# Patient Record
Sex: Female | Born: 1970 | Race: Black or African American | Hispanic: No | Smoking: Never smoker
Health system: Southern US, Community
[De-identification: ages and names within clinical notes are randomized; demographics above are authoritative.]

## PROBLEM LIST (undated history)

## (undated) DIAGNOSIS — E669 Obesity, unspecified: Secondary | ICD-10-CM

## (undated) HISTORY — PX: EYE SURGERY: SHX253

---

## 2018-09-02 ENCOUNTER — Encounter (HOSPITAL_BASED_OUTPATIENT_CLINIC_OR_DEPARTMENT_OTHER): Payer: Self-pay | Admitting: Emergency Medicine

## 2018-09-02 ENCOUNTER — Emergency Department (HOSPITAL_BASED_OUTPATIENT_CLINIC_OR_DEPARTMENT_OTHER)
Admission: EM | Admit: 2018-09-02 | Discharge: 2018-09-02 | Disposition: A | Discharge status: Home / Self Care | Payer: Self-pay | Attending: Emergency Medicine | Admitting: Emergency Medicine

## 2018-09-02 ENCOUNTER — Emergency Department (HOSPITAL_BASED_OUTPATIENT_CLINIC_OR_DEPARTMENT_OTHER): Payer: Self-pay

## 2018-09-02 ENCOUNTER — Other Ambulatory Visit: Payer: Self-pay

## 2018-09-02 DIAGNOSIS — N83291 Other ovarian cyst, right side: Secondary | ICD-10-CM | POA: Insufficient documentation

## 2018-09-02 DIAGNOSIS — A599 Trichomoniasis, unspecified: Secondary | ICD-10-CM

## 2018-09-02 DIAGNOSIS — A5900 Urogenital trichomoniasis, unspecified: Secondary | ICD-10-CM | POA: Insufficient documentation

## 2018-09-02 HISTORY — DX: Obesity, unspecified: E66.9

## 2018-09-02 LAB — URINALYSIS, ROUTINE W REFLEX MICROSCOPIC
Bilirubin Urine: NEGATIVE
Glucose, UA: NEGATIVE mg/dL
Hgb urine dipstick: NEGATIVE
Ketones, ur: NEGATIVE mg/dL
Nitrite: NEGATIVE
Protein, ur: NEGATIVE mg/dL
Specific Gravity, Urine: 1.01 (ref 1.005–1.030)
pH: 6.5 (ref 5.0–8.0)

## 2018-09-02 LAB — URINALYSIS, MICROSCOPIC (REFLEX)

## 2018-09-02 LAB — WET PREP, GENITAL
Sperm: NONE SEEN
Yeast Wet Prep HPF POC: NONE SEEN

## 2018-09-02 LAB — PREGNANCY, URINE: Preg Test, Ur: NEGATIVE

## 2018-09-02 MED ORDER — LIDOCAINE HCL (PF) 1 % IJ SOLN
INTRAMUSCULAR | Status: AC
  Administered 2018-09-02: 1.2 mL
  Filled 2018-09-02: qty 5

## 2018-09-02 MED ORDER — CEFTRIAXONE SODIUM 250 MG IJ SOLR
250.0000 mg | Freq: Once | INTRAMUSCULAR | Status: AC
  Administered 2018-09-02: 250 mg via INTRAMUSCULAR
  Filled 2018-09-02: qty 250

## 2018-09-02 MED ORDER — METRONIDAZOLE 500 MG PO TABS: 500.0000 mg | ORAL_TABLET | Freq: Two times a day (BID) | ORAL | 0 refills | Status: AC

## 2018-09-02 MED ORDER — DOXYCYCLINE HYCLATE 100 MG PO CAPS: 100.0000 mg | ORAL_CAPSULE | Freq: Two times a day (BID) | ORAL | 0 refills | Status: AC

## 2018-09-02 MED FILL — metroNIDAZOLE 500 MG TABS: 500 | 14 days supply | Qty: 28 | Fill #0

## 2018-09-02 MED FILL — DOXYCYCLINE HYCLATE 100 MG: 100 | 14 days supply | Qty: 28 | Fill #0

## 2018-09-02 NOTE — ED Triage Notes (Signed)
Left sided abd pain that radiates to left flank.  Also yellow vaginal discharge and itching.

## 2018-09-02 NOTE — ED Notes (Signed)
Patient transported to Ultrasound 

## 2018-09-02 NOTE — ED Notes (Signed)
ED Provider at bedside. 

## 2018-09-02 NOTE — ED Provider Notes (Signed)
[facility] EMERGENCY DEPARTMENT Provider Note   CSN: [id number] Arrival date & time: 09/02/18  4098    History   Chief Complaint Chief Complaint  Patient presents with   Abdominal Pain    HPI Victoria Reese is a 48 y.o. female.     49 year old female who presents with left-sided abdominal pain.  2 days ago, she began having intermittent, sharp left-sided abdominal pain that has been coming and going but has progressively worsened since it began.  She states that laying on her left side and moving certain ways makes the pain worse.  She has been eating and drinking normally with no nausea, vomiting, diarrhea, constipation, or urinary symptoms.  1 day ago, she began having yellow vaginal discharge which is unusual for her.  No vaginal bleeding.  Last menstrual period was mid March.  She is sexually active with one long-term partner, does not use protection.  The history is provided by the patient.  Abdominal Pain    Past Medical History:  Diagnosis Date   Obesity     There are no active problems to display for this patient.   Past Surgical History:  Procedure Laterality Date   EYE SURGERY Bilateral    X2:  Age 30 and Age 89      Patient unsure of eye condition     OB History    Gravida  6   Para  6   Term  6   Preterm  0   AB  0   Living  6     SAB  0   TAB  0   Ectopic  0   Multiple  0   Live Births  6        Obstetric Comments  All term, vaginal deliveries         Home Medications    Prior to Admission medications   Medication Sig Start Date End Date Taking? Authorizing Provider  UNABLE TO FIND Apply 1 drop to eye 2 (two) times a day. Prednisone eye drops   Yes [provider]  doxycycline (VIBRAMYCIN) 100 MG capsule Take 1 capsule (100 mg total) by mouth 2 (two) times daily. 09/02/18   Yolandra Habig, Ambrose Finland, MD  metroNIDAZOLE (FLAGYL) 500 MG tablet Take 1 tablet (500 mg total) by mouth 2 (two) times daily. Do not drink  alcohol while on this medication 09/02/18   Tamelia Michalowski, Ambrose Finland, MD    Family History No family history on file.  Social History Social History   Tobacco Use   Smoking status: Never Smoker   Smokeless tobacco: Never Used  Substance Use Topics   Alcohol use: Never    Frequency: Never   Drug use: Never     Allergies   Patient has no known allergies.   Review of Systems Review of Systems  Gastrointestinal: Positive for abdominal pain.   All other systems reviewed and are negative except that which was mentioned in HPI   Physical Exam Updated Vital Signs BP (!) 141/88 (BP Location: Right Arm)    Pulse 61    Temp 98.1 F (36.7 C) (Oral)    Resp 16    Ht  (1.626 m)    Wt 74.8 kg    LMP 07/17/2018 Comment: Irregular periods since March 2020   SpO2 100%    BMI 28.32 kg/m   Physical Exam Vitals signs and nursing note reviewed. Exam conducted with a chaperone present.  Constitutional:  General: She is not in acute distress.    Appearance: She is well-developed.  HENT:     Head: Normocephalic and atraumatic.  Eyes:     Conjunctiva/sclera: Conjunctivae normal.  Neck:     Musculoskeletal: Neck supple.  Cardiovascular:     Rate and Rhythm: Normal rate and regular rhythm.     Heart sounds: Normal heart sounds. No murmur.  Pulmonary:     Effort: Pulmonary effort is normal.     Breath sounds: Normal breath sounds.  Abdominal:     General: Bowel sounds are normal. There is no distension.     Palpations: Abdomen is soft.     Tenderness: There is abdominal tenderness in the suprapubic area and left lower quadrant. There is no guarding or rebound.  Genitourinary:    Vagina: Vaginal discharge present.     Cervix: Discharge and friability present. No cervical motion tenderness.     Uterus: Normal.      Adnexa: Right adnexa normal.       Left: Tenderness present. No mass.       Comments: Copious yellow-white discharge in vaginal vault and cervical office, friable  cervix, left adnexal tenderness with no CMT Skin:    General: Skin is warm and dry.  Neurological:     Mental Status: She is alert and oriented to person, place, and time.     Comments: Fluent speech  Psychiatric:        Judgment: Judgment normal.      ED Treatments / Results  Labs (all labs ordered are listed, but only abnormal results are displayed) Labs Reviewed  WET PREP, GENITAL - Abnormal; Notable for the following components:      Result Value   Trich, Wet Prep PRESENT (*)    Clue Cells Wet Prep HPF POC PRESENT (*)    WBC, Wet Prep HPF POC MANY (*)    All other components within normal limits  URINALYSIS, ROUTINE W REFLEX MICROSCOPIC - Abnormal; Notable for the following components:   Leukocytes,Ua MODERATE (*)    All other components within normal limits  URINALYSIS, MICROSCOPIC (REFLEX) - Abnormal; Notable for the following components:   Bacteria, UA FEW (*)    Trichomonas, UA PRESENT (*)    All other components within normal limits  PREGNANCY, URINE  GC/CHLAMYDIA PROBE AMP ([place]) NOT AT [facility]    EKG None  Radiology US Transvaginal Non-ob  Result Date: 09/02/2018 CLINICAL DATA:  Left pelvic pain for 3 days. Clinical suspicion for ovarian torsion. LMP 07/17/2018. EXAM: TRANSABDOMINAL AND TRANSVAGINAL ULTRASOUND OF PELVIS DOPPLER ULTRASOUND OF OVARIES TECHNIQUE: Both transabdominal and transvaginal ultrasound examinations of the pelvis were performed. Transabdominal technique was performed for global imaging of the pelvis including uterus, ovaries, adnexal regions, and pelvic cul-de-sac. It was necessary to proceed with endovaginal exam following the transabdominal exam to visualize the ovaries and cystic lesion in right adnexa. Color and duplex Doppler ultrasound was utilized to evaluate blood flow to the ovaries. COMPARISON:  None. FINDINGS: Uterus Measurements: 13.0 x 6.3 x 8.0 cm = volume: 346 mL. Several small uterine fibroids are seen, largest measuring 2.3  cm. Endometrium Thickness: 10 mm.  No focal abnormality visualized. Right ovary Measurements: 3.3 x 2.6 x 3.1 cm = volume: 13.8 mL. A cystic lesion with a mural nodular density is seen which measures 2.6 x 2.4 x 2.4 cm. The mural nodule measures 1.1 cm. A thickened septation is also seen measuring 4 mm in thickness. No blood flow is seen  within the mural nodule or septation on color Doppler ultrasound. Left ovary Measurements: 1.6 x 2.3 x 2.3 cm = volume: 4.5 mL. Normal appearance/no adnexal mass. Pulsed Doppler evaluation of both ovaries demonstrates normal low-resistance arterial and venous waveforms. Other findings No abnormal free fluid. IMPRESSION: No sonographic evidence of ovarian torsion. Small uterine fibroids measuring up to 2.3 cm. 2.6 cm complex cystic lesion in right ovary, which contains a solid mural nodule and thick septation. This is suspicious for cystic ovarian neoplasm. Recommend nonemergent pelvic MRI without and with contrast for further characterization. This recommendation follows the consensus statement: Management of Asymptomatic Ovarian and Other Adnexal Cysts Imaged at US: Society of Radiologists in Ultrasound Consensus Conference Statement. Radiology 2010; [phone]. Electronically Signed   By: Myles RosenthalJohn  Stahl M.D.   On: 09/02/2018 09:52   [place] Pelvis Complete  Result Date: 09/02/2018 CLINICAL DATA:  Left pelvic pain for 3 days. Clinical suspicion for ovarian torsion. LMP 07/17/2018. EXAM: TRANSABDOMINAL AND TRANSVAGINAL ULTRASOUND OF PELVIS DOPPLER ULTRASOUND OF OVARIES TECHNIQUE: Both transabdominal and transvaginal ultrasound examinations of the pelvis were performed. Transabdominal technique was performed for global imaging of the pelvis including uterus, ovaries, adnexal regions, and pelvic cul-de-sac. It was necessary to proceed with endovaginal exam following the transabdominal exam to visualize the ovaries and cystic lesion in right adnexa. Color and duplex Doppler ultrasound was  utilized to evaluate blood flow to the ovaries. COMPARISON:  None. FINDINGS: Uterus Measurements: 13.0 x 6.3 x 8.0 cm = volume: 346 mL. Several small uterine fibroids are seen, largest measuring 2.3 cm. Endometrium Thickness: 10 mm.  No focal abnormality visualized. Right ovary Measurements: 3.3 x 2.6 x 3.1 cm = volume: 13.8 mL. A cystic lesion with a mural nodular density is seen which measures 2.6 x 2.4 x 2.4 cm. The mural nodule measures 1.1 cm. A thickened septation is also seen measuring 4 mm in thickness. No blood flow is seen within the mural nodule or septation on color Doppler ultrasound. Left ovary Measurements: 1.6 x 2.3 x 2.3 cm = volume: 4.5 mL. Normal appearance/no adnexal mass. Pulsed Doppler evaluation of both ovaries demonstrates normal low-resistance arterial and venous waveforms. Other findings No abnormal free fluid. IMPRESSION: No sonographic evidence of ovarian torsion. Small uterine fibroids measuring up to 2.3 cm. 2.6 cm complex cystic lesion in right ovary, which contains a solid mural nodule and thick septation. This is suspicious for cystic ovarian neoplasm. Recommend nonemergent pelvic MRI without and with contrast for further characterization. This recommendation follows the consensus statement: Management of Asymptomatic Ovarian and Other Adnexal Cysts Imaged at US: Society of Radiologists in Ultrasound Consensus Conference Statement. Radiology 2010; [phone]. Electronically Signed   By: Myles RosenthalJohn  Stahl M.D.   On: 09/02/2018 09:52   [place] Art/ven Flow Abd Pelv Doppler  Result Date: 09/02/2018 CLINICAL DATA:  Left pelvic pain for 3 days. Clinical suspicion for ovarian torsion. LMP 07/17/2018. EXAM: TRANSABDOMINAL AND TRANSVAGINAL ULTRASOUND OF PELVIS DOPPLER ULTRASOUND OF OVARIES TECHNIQUE: Both transabdominal and transvaginal ultrasound examinations of the pelvis were performed. Transabdominal technique was performed for global imaging of the pelvis including uterus, ovaries, adnexal  regions, and pelvic cul-de-sac. It was necessary to proceed with endovaginal exam following the transabdominal exam to visualize the ovaries and cystic lesion in right adnexa. Color and duplex Doppler ultrasound was utilized to evaluate blood flow to the ovaries. COMPARISON:  None. FINDINGS: Uterus Measurements: 13.0 x 6.3 x 8.0 cm = volume: 346 mL. Several small uterine fibroids are seen, largest measuring 2.3 cm. Endometrium  Thickness: 10 mm.  No focal abnormality visualized. Right ovary Measurements: 3.3 x 2.6 x 3.1 cm = volume: 13.8 mL. A cystic lesion with a mural nodular density is seen which measures 2.6 x 2.4 x 2.4 cm. The mural nodule measures 1.1 cm. A thickened septation is also seen measuring 4 mm in thickness. No blood flow is seen within the mural nodule or septation on color Doppler ultrasound. Left ovary Measurements: 1.6 x 2.3 x 2.3 cm = volume: 4.5 mL. Normal appearance/no adnexal mass. Pulsed Doppler evaluation of both ovaries demonstrates normal low-resistance arterial and venous waveforms. Other findings No abnormal free fluid. IMPRESSION: No sonographic evidence of ovarian torsion. Small uterine fibroids measuring up to 2.3 cm. 2.6 cm complex cystic lesion in right ovary, which contains a solid mural nodule and thick septation. This is suspicious for cystic ovarian neoplasm. Recommend nonemergent pelvic MRI without and with contrast for further characterization. This recommendation follows the consensus statement: Management of Asymptomatic Ovarian and Other Adnexal Cysts Imaged at [place]: Society of Radiologists in Ultrasound Consensus Conference Statement. Radiology 2010; [id number]. Electronically Signed   By: Myles Rosenthal M.D.   On: 09/02/2018 09:52    Procedures Procedures (including critical care time)  Medications Ordered in ED Medications  cefTRIAXone (ROCEPHIN) injection 250 mg (has no administration in time range)     Initial Impression / Assessment and Plan / ED Course  I  have reviewed the triage vital signs and the nursing notes.  Pertinent labs & imaging results that were available during my care of the patient were reviewed by me and considered in my medical decision making (see chart for details).        Well appearing, normal Vs. Focal LLQ tenderness. DDx includes ovarian cyst or torsion, PID, TOA, kidney stone, pyelonephritis. Less likely diverticulitis or constipation given no GI symptoms. Highly doubt appendicitis based on location primarily in LLQ.  Lab work shows UA without evidence of UTI.  Wet prep and urine positive for trichomonas and clue cells.  UPT negative.  Attained pelvic ultrasound to rule out TOA or torsion.  Ultrasound notable only for complex right ovarian cyst with recommendation for pelvic MRI to rule out neoplasm.  I discussed these findings with the patient and emphasized the importance of follow-up with OB/GYN for further imaging and investigation of right ovarian cyst.  Because of the patient's abdominal pain, will treat for PID with 14-day course of Flagyl and doxycycline as well as ceftriaxone here.  Counseled on having partners tested and treated prior to any future intercourse.  Extensively reviewed return precautions including any worsening abdominal pain, fevers, or vomiting.  She voiced understanding. Final Clinical Impressions(s) / ED Diagnoses   Final diagnoses:  Trichomonal infection  Other ovarian cyst, right side    ED Discharge Orders         Ordered    doxycycline (VIBRAMYCIN) 100 MG capsule  2 times daily     09/02/18 1010    metroNIDAZOLE (FLAGYL) 500 MG tablet  2 times daily     09/02/18 1010           Rosabelle Jupin, Ambrose Finland, MD 09/02/18 1013

## 2018-09-02 NOTE — Discharge Instructions (Signed)
IT IS VERY IMPORTANT TO FOLLOW UP WITH THE [facility] FOR FURTHER IMAGING OF YOUR RIGHT OVARY. TAKE ALL MEDICATIONS UNTIL FINISHED (14 DAYS) AND RETURN TO ER IF ANY FEVER, VOMITING, OR WORSENING PAIN. HAVE ALL PARTNERS TESTED AND TREATED PRIOR TO ANY FUTURE INTERCOURSE.

## 2018-09-03 LAB — GC/CHLAMYDIA PROBE AMP (~~LOC~~) NOT AT ARMC
Chlamydia: NEGATIVE
Neisseria Gonorrhea: NEGATIVE

## 2021-02-23 ENCOUNTER — Emergency Department (INDEPENDENT_AMBULATORY_CARE_PROVIDER_SITE_OTHER)
Admission: EM | Admit: 2021-02-23 | Discharge: 2021-02-23 | Disposition: A | Discharge status: Home / Self Care | Payer: BC Managed Care – PPO | Source: Home / Self Care

## 2021-02-23 ENCOUNTER — Emergency Department (INDEPENDENT_AMBULATORY_CARE_PROVIDER_SITE_OTHER): Payer: BC Managed Care – PPO

## 2021-02-23 ENCOUNTER — Other Ambulatory Visit: Payer: Self-pay

## 2021-02-23 DIAGNOSIS — M79621 Pain in right upper arm: Secondary | ICD-10-CM

## 2021-02-23 DIAGNOSIS — M79601 Pain in right arm: Secondary | ICD-10-CM

## 2021-02-23 DIAGNOSIS — M7989 Other specified soft tissue disorders: Secondary | ICD-10-CM | POA: Diagnosis not present

## 2021-02-23 MED ORDER — BACLOFEN 10 MG PO TABS: 10.0000 mg | ORAL_TABLET | Freq: Three times a day (TID) | ORAL | 0 refills | Status: AC

## 2021-02-23 NOTE — ED Triage Notes (Signed)
Pt presents with rt arm pain and swelling that began last night. Pt using ice for comfort. No known injury

## 2021-02-23 NOTE — ED Provider Notes (Signed)
[facility]    CSN: [id number] Arrival date & time: 02/23/21  0911      History   Chief Complaint Chief Complaint  Patient presents with   Arm Swelling    rt    HPI Tahra Hitzeman is a 50 y.o. female.   HPI 50 year old female presents with right arm pain and swelling since last night.  Patient denies insult or injury to affected area of right arm.  Patient denies any associated symptoms or modifying factors.  Past Medical History:  Diagnosis Date   Obesity     There are no problems to display for this patient.   Past Surgical History:  Procedure Laterality Date   EYE SURGERY Bilateral    X2:  Age 84 and Age 40      Patient unsure of eye condition    OB History     Gravida  6   Para  6   Term  6   Preterm  0   AB  0   Living  6      SAB  0   IAB  0   Ectopic  0   Multiple  0   Live Births  6        Obstetric Comments  All term, vaginal deliveries          Home Medications    Prior to Admission medications   Medication Sig Start Date End Date Taking? Authorizing Provider  baclofen (LIORESAL) 10 MG tablet Take 1 tablet (10 mg total) by mouth 3 (three) times daily. 02/23/21  Yes Trevor Iha, FNP  doxycycline (VIBRAMYCIN) 100 MG capsule Take 1 capsule (100 mg total) by mouth 2 (two) times daily. Patient not taking: Reported on 02/23/2021 09/02/18   Little, Ambrose Finland, MD  metroNIDAZOLE (FLAGYL) 500 MG tablet Take 1 tablet (500 mg total) by mouth 2 (two) times daily. Do not drink alcohol while on this medication Patient not taking: Reported on 02/23/2021 09/02/18   Little, Ambrose Finland, MD  UNABLE TO FIND Apply 1 drop to eye 2 (two) times a day. Prednisone eye drops Patient not taking: Reported on 02/23/2021    [provider]    Family History History reviewed. No pertinent family history.  Social History Social History   Tobacco Use   Smoking status: Never   Smokeless tobacco: Never  Vaping Use   Vaping  Use: Never used  Substance Use Topics   Alcohol use: Never   Drug use: Never     Allergies   Patient has no known allergies.   Review of Systems Review of Systems  Musculoskeletal:        Right arm pain and swelling for 1 day  All other systems reviewed and are negative.   Physical Exam Triage Vital Signs ED Triage Vitals [02/23/21 0926]  Enc Vitals Group     BP 129/85     Pulse Rate 63     Resp 14     Temp 98.2 F (36.8 C)     Temp Source Oral     SpO2 98 %     Weight      Height      Head Circumference      Peak Flow      Pain Score 4     Pain Loc      Pain Edu?      Excl. in GC?    No data found.  Updated Vital Signs BP 129/85 (BP Location:  Left Arm)   Pulse 63   Temp 98.2 F (36.8 C) (Oral)   Resp 14   SpO2 98%      Physical Exam Vitals and nursing note reviewed.  Constitutional:      General: She is not in acute distress.    Appearance: Normal appearance. She is obese. She is not ill-appearing.  HENT:     Head: Normocephalic and atraumatic.     Mouth/Throat:     Mouth: Mucous membranes are moist.     Pharynx: Oropharynx is clear.  Eyes:     Extraocular Movements: Extraocular movements intact.     Conjunctiva/sclera: Conjunctivae normal.     Pupils: Pupils are equal, round, and reactive to light.  Cardiovascular:     Rate and Rhythm: Normal rate and regular rhythm.     Pulses: Normal pulses.     Heart sounds: Normal heart sounds.  Pulmonary:     Effort: Pulmonary effort is normal.     Breath sounds: Normal breath sounds.  Musculoskeletal:        General: Normal range of motion.     Cervical back: Normal range of motion and neck supple.  Skin:    General: Skin is warm and dry.  Neurological:     General: No focal deficit present.     Mental Status: She is alert and oriented to person, place, and time.  Psychiatric:        Mood and Affect: Mood normal.        Behavior: Behavior normal.     UC Treatments / Results  Labs (all labs  ordered are listed, but only abnormal results are displayed) Labs Reviewed - No data to display  EKG   Radiology US Venous Img Upper Uni Right(DVT)  Result Date: 02/23/2021 CLINICAL DATA:  Right upper extremity pain and edema for the past day. Evaluate for DVT. EXAM: RIGHT UPPER EXTREMITY VENOUS DOPPLER ULTRASOUND TECHNIQUE: Gray-scale sonography with graded compression, as well as color Doppler and duplex ultrasound were performed to evaluate the upper extremity deep venous system from the level of the subclavian vein and including the jugular, axillary, basilic, radial, ulnar and upper cephalic vein. Spectral Doppler was utilized to evaluate flow at rest and with distal augmentation maneuvers. COMPARISON:  None. FINDINGS: Contralateral Subclavian Vein: Respiratory phasicity is normal and symmetric with the symptomatic side. No evidence of thrombus. Normal compressibility. Internal Jugular Vein: No evidence of thrombus. Normal compressibility, respiratory phasicity and response to augmentation. Subclavian Vein: No evidence of thrombus. Normal compressibility, respiratory phasicity and response to augmentation. Axillary Vein: No evidence of thrombus. Normal compressibility, respiratory phasicity and response to augmentation. Cephalic Vein: No evidence of thrombus. Normal compressibility, respiratory phasicity and response to augmentation. Basilic Vein: No evidence of thrombus. Normal compressibility, respiratory phasicity and response to augmentation. Brachial Veins: No evidence of thrombus. Normal compressibility, respiratory phasicity and response to augmentation. Radial Veins: No evidence of thrombus. Normal compressibility, respiratory phasicity and response to augmentation. Ulnar Veins: No evidence of thrombus. Normal compressibility, respiratory phasicity and response to augmentation. Venous Reflux:  None visualized. Other Findings:  None visualized. IMPRESSION: No evidence of DVT within the right  upper extremity. Electronically Signed   By: Simonne Come M.D.   On: 02/23/2021 12:17    Procedures Procedures (including critical care time)  Medications Ordered in UC Medications - No data to display  Initial Impression / Assessment and Plan / UC Course  I have reviewed the triage vital signs and the nursing notes.  Pertinent labs & imaging results that were available during my care of the patient were reviewed by me and considered in my medical decision making (see chart for details).     MDM: 1. Right arm pain and swelling-dedicated ultrasound revealed (above), Advised patient may take medication daily or as needed for right arm strain.  Advised patient ultrasound revealed negative for thrombus/clot.  Ultrasound results provided to patient today prior to discharge.  Work note provided per patient request.  Patient discharged home, hemodynamically stable. Final Clinical Impressions(s) / UC Diagnoses   Final diagnoses:  Right arm pain     Discharge Instructions      Advised patient may take medication daily or as needed for right arm strain.  Advised patient ultrasound revealed negative for thrombus/clot.  Ultrasound results provided to patient today prior to discharge.  Work note provided per patient request.     ED Prescriptions     Medication Sig Dispense Auth. Provider   baclofen (LIORESAL) 10 MG tablet Take 1 tablet (10 mg total) by mouth 3 (three) times daily. 30 each Trevor Iha, FNP      PDMP not reviewed this encounter.   Trevor Iha, FNP 02/23/21 1254

## 2021-02-23 NOTE — Discharge Instructions (Addendum)
Advised patient may take medication daily or as needed for right arm strain.  Advised patient ultrasound revealed negative for thrombus/clot.  Ultrasound results provided to patient today prior to discharge.  Work note provided per patient request.

## 2022-11-19 IMAGING — US US EXTREM  UP VENOUS*R*
1 series · 13 of 24 positions shown · non-contrast
Comparison: None.

CLINICAL DATA: Right upper extremity pain and edema for the past
day. Evaluate for DVT.



[Series 1: us venous img upper uni right (dvt) · portal-venous · 13 of 41 slices shown]
[im 1/41]
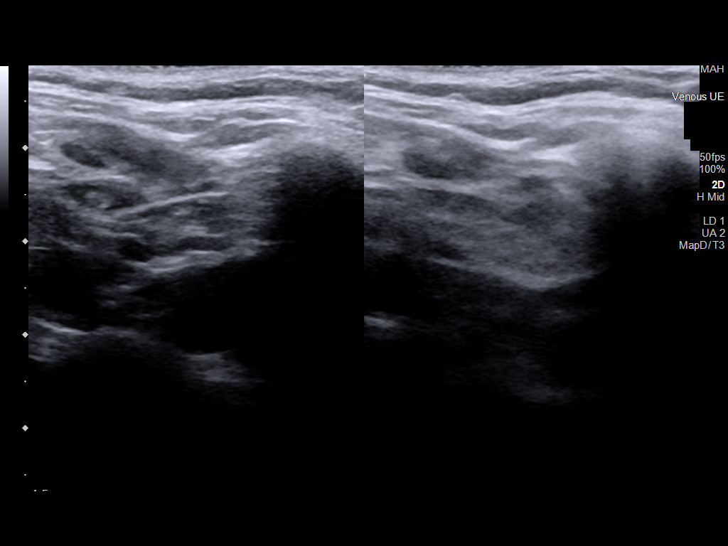
[im 4/41]
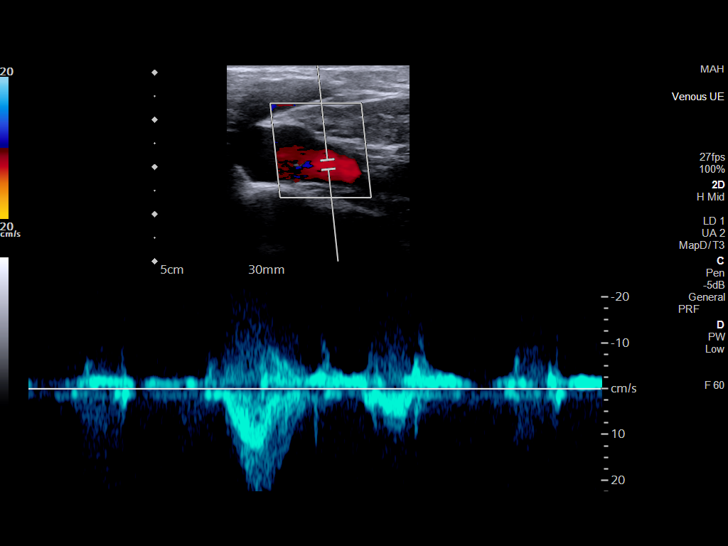
[im 7/41]
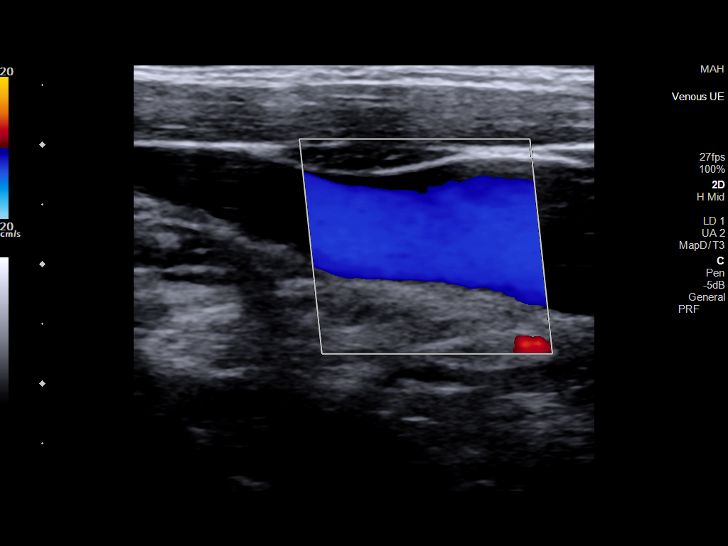
[im 11/41]
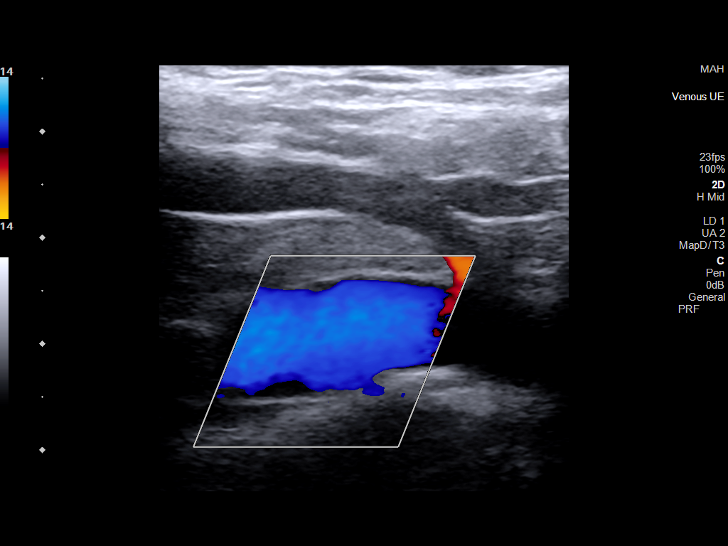
[im 14/41]
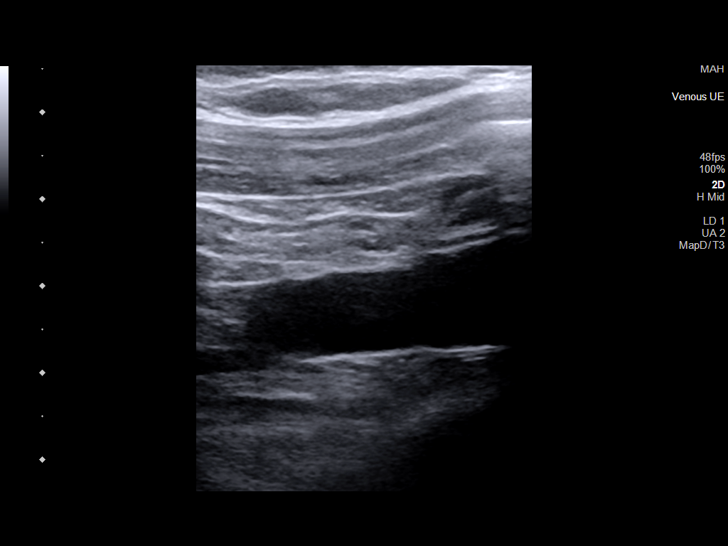
[im 18/41]
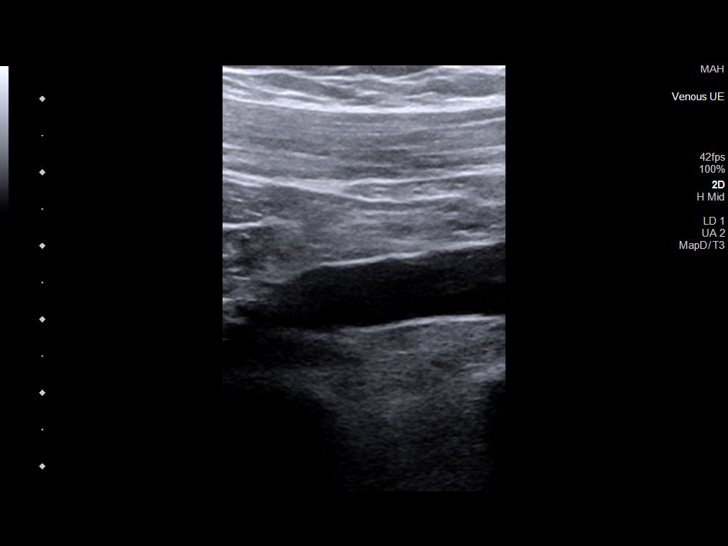
[im 21/41]
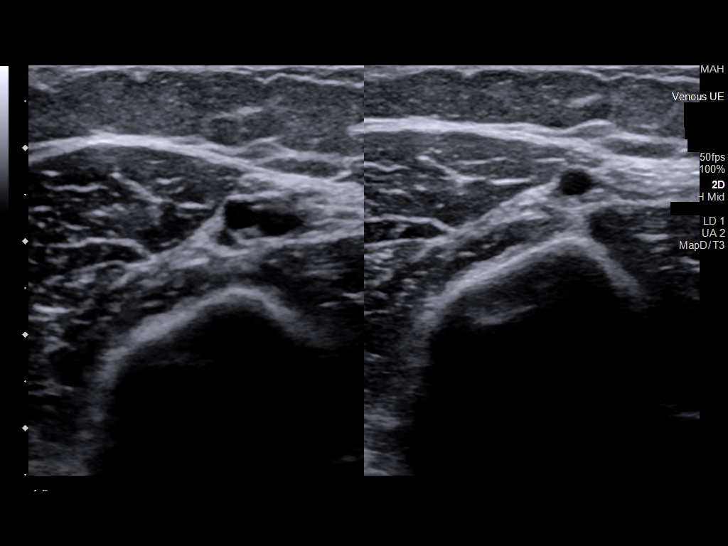
[im 23/41]
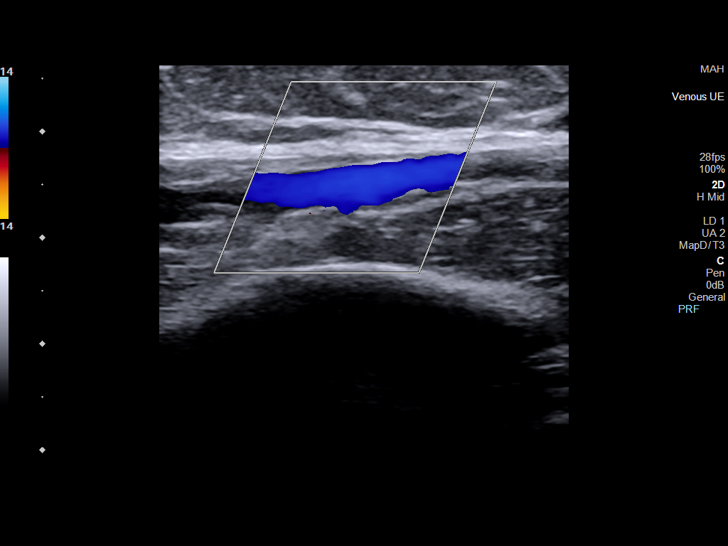
[im 27/41]
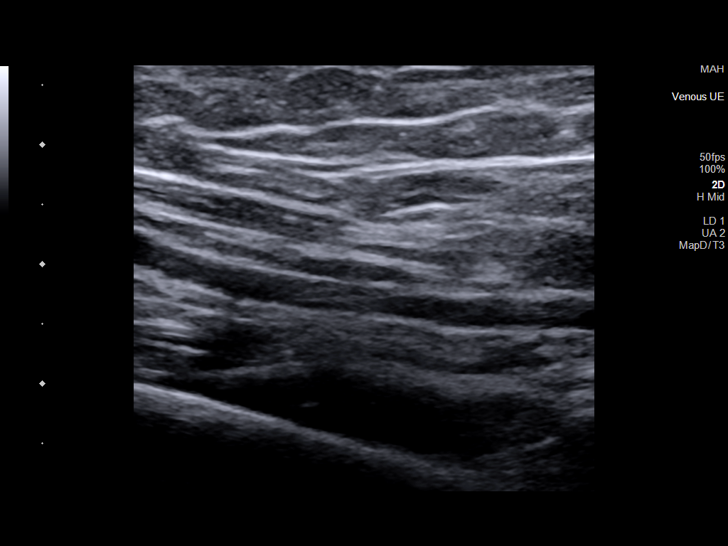
[im 30/41]
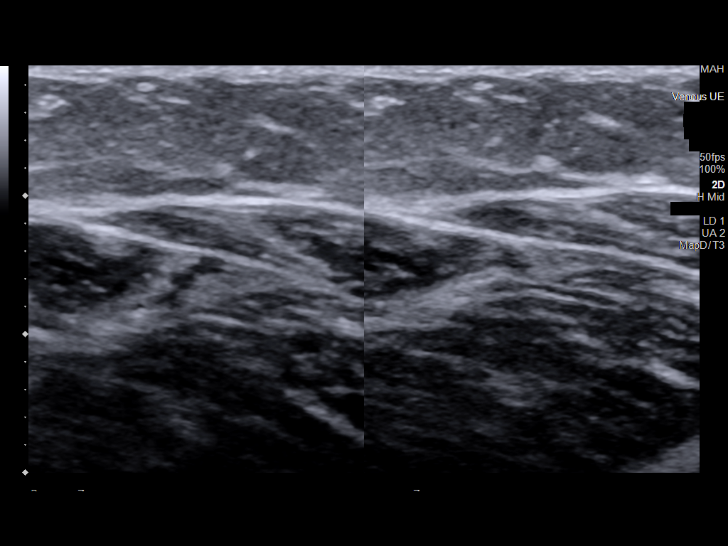
[im 34/41]
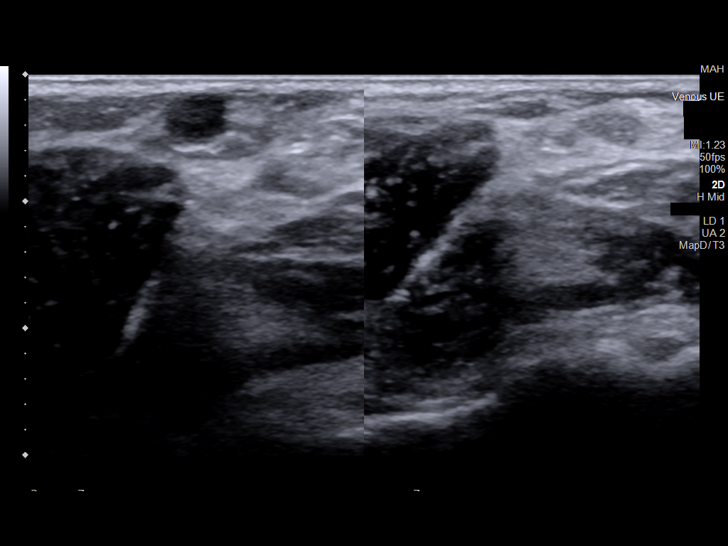
[im 37/41]
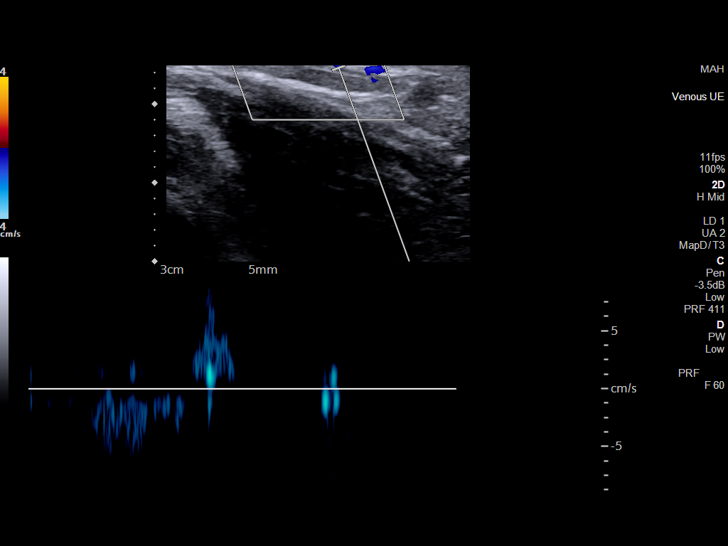
[im 41/41]
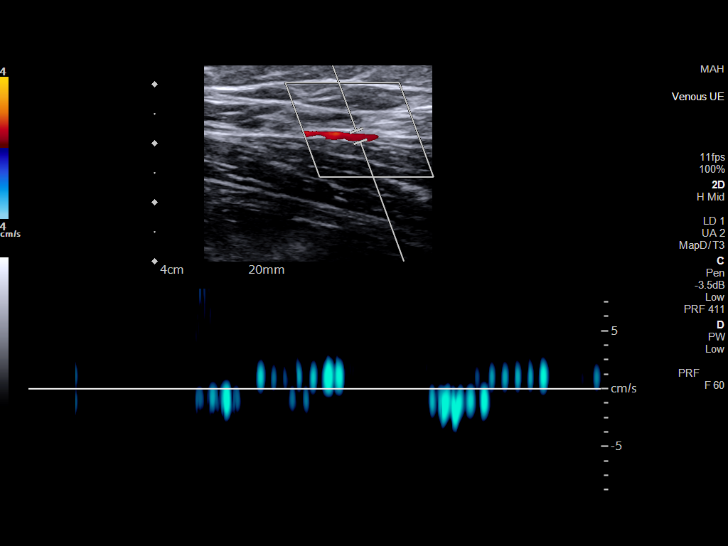

[13 of 24 positions shown; findings below may reference images not displayed]

FINDINGS: Contralateral Subclavian Vein: Respiratory phasicity is normal and
symmetric with the symptomatic side. No evidence of thrombus. Normal
compressibility.

Internal Jugular Vein: No evidence of thrombus. Normal
compressibility, respiratory phasicity and response to augmentation.

Subclavian Vein: No evidence of thrombus. Normal compressibility,
respiratory phasicity and response to augmentation.

Axillary Vein: No evidence of thrombus. Normal compressibility,
respiratory phasicity and response to augmentation.

Cephalic Vein: No evidence of thrombus. Normal compressibility,
respiratory phasicity and response to augmentation.

Basilic Vein: No evidence of thrombus. Normal compressibility,
respiratory phasicity and response to augmentation.

Brachial Veins: No evidence of thrombus. Normal compressibility,
respiratory phasicity and response to augmentation.

Radial Veins: No evidence of thrombus. Normal compressibility,
respiratory phasicity and response to augmentation.

Ulnar Veins: No evidence of thrombus. Normal compressibility,
respiratory phasicity and response to augmentation.

Venous Reflux:  None visualized.

Other Findings:  None visualized.
IMPRESSION: No evidence of DVT within the right upper extremity.
